# Patient Record
Sex: Female | Born: 1965 | Race: White | Hispanic: No | Marital: Married | Smoking: Former smoker
Health system: Southern US, Community
[De-identification: ages and names within clinical notes are randomized; demographics above are authoritative.]

---

## 1999-11-26 ENCOUNTER — Ambulatory Visit (HOSPITAL_COMMUNITY): Admission: RE | Admit: 1999-11-26 | Discharge: 1999-11-26 | Payer: Self-pay | Admitting: Family Medicine

## 1999-11-26 ENCOUNTER — Encounter: Payer: Self-pay | Admitting: Family Medicine

## 2000-03-16 ENCOUNTER — Other Ambulatory Visit: Admission: RE | Admit: 2000-03-16 | Discharge: 2000-03-16 | Payer: Self-pay | Admitting: Obstetrics and Gynecology

## 2000-03-30 ENCOUNTER — Encounter (INDEPENDENT_AMBULATORY_CARE_PROVIDER_SITE_OTHER): Payer: Self-pay

## 2000-03-30 ENCOUNTER — Other Ambulatory Visit: Admission: RE | Admit: 2000-03-30 | Discharge: 2000-03-30 | Payer: Self-pay | Admitting: Obstetrics and Gynecology

## 2000-11-17 ENCOUNTER — Encounter: Admission: RE | Admit: 2000-11-17 | Discharge: 2000-11-17 | Payer: Self-pay | Admitting: Family Medicine

## 2000-11-17 ENCOUNTER — Encounter: Payer: Self-pay | Admitting: Family Medicine

## 2001-01-07 ENCOUNTER — Ambulatory Visit (HOSPITAL_COMMUNITY): Admission: RE | Admit: 2001-01-07 | Discharge: 2001-01-07 | Payer: Self-pay | Admitting: Family Medicine

## 2001-01-07 ENCOUNTER — Encounter: Payer: Self-pay | Admitting: Family Medicine

## 2001-01-10 ENCOUNTER — Emergency Department (HOSPITAL_COMMUNITY): Admission: EM | Admit: 2001-01-10 | Discharge: 2001-01-10 | Payer: Self-pay | Admitting: Emergency Medicine

## 2001-01-13 ENCOUNTER — Encounter (INDEPENDENT_AMBULATORY_CARE_PROVIDER_SITE_OTHER): Payer: Self-pay | Admitting: Specialist

## 2001-01-13 ENCOUNTER — Observation Stay (HOSPITAL_COMMUNITY): Admission: RE | Admit: 2001-01-13 | Discharge: 2001-01-14 | Payer: Self-pay | Admitting: Surgery

## 2001-01-13 ENCOUNTER — Encounter: Payer: Self-pay | Admitting: Surgery

## 2001-08-18 ENCOUNTER — Other Ambulatory Visit: Admission: RE | Admit: 2001-08-18 | Discharge: 2001-08-18 | Payer: Self-pay | Admitting: Obstetrics and Gynecology

## 2002-11-21 ENCOUNTER — Other Ambulatory Visit: Admission: RE | Admit: 2002-11-21 | Discharge: 2002-11-21 | Payer: Self-pay | Admitting: Obstetrics and Gynecology

## 2003-06-01 ENCOUNTER — Encounter: Payer: Self-pay | Admitting: Obstetrics and Gynecology

## 2003-06-01 ENCOUNTER — Ambulatory Visit (HOSPITAL_COMMUNITY): Admission: RE | Admit: 2003-06-01 | Discharge: 2003-06-01 | Payer: Self-pay | Admitting: Obstetrics and Gynecology

## 2005-01-13 ENCOUNTER — Other Ambulatory Visit: Admission: RE | Admit: 2005-01-13 | Discharge: 2005-01-13 | Payer: Self-pay | Admitting: Obstetrics and Gynecology

## 2007-11-10 ENCOUNTER — Other Ambulatory Visit: Payer: Self-pay | Admitting: Obstetrics and Gynecology

## 2007-12-01 ENCOUNTER — Ambulatory Visit (HOSPITAL_COMMUNITY): Admission: RE | Admit: 2007-12-01 | Discharge: 2007-12-01 | Payer: Self-pay | Admitting: Obstetrics and Gynecology

## 2007-12-01 ENCOUNTER — Encounter (INDEPENDENT_AMBULATORY_CARE_PROVIDER_SITE_OTHER): Payer: Self-pay | Admitting: Obstetrics and Gynecology

## 2009-03-12 ENCOUNTER — Encounter
Admission: RE | Admit: 2009-03-12 | Discharge: 2009-03-12 | Payer: Self-pay | Admitting: Physical Medicine and Rehabilitation

## 2010-05-28 ENCOUNTER — Encounter: Admission: RE | Admit: 2010-05-28 | Discharge: 2010-05-28 | Payer: Self-pay | Admitting: Orthopedic Surgery

## 2011-02-17 NOTE — Op Note (Signed)
[name], [name]            ACCOUNT NO.:  [id number]   MEDICAL RECORD NO.:  [id number]          PATIENT TYPE:  AMB   LOCATION:  SDC                           FACILITY:  [facility]   PHYSICIAN:  Malva Limes, M.D.    DATE OF BIRTH:  October 15, 1965   DATE OF PROCEDURE:  DATE OF DISCHARGE:                               OPERATIVE REPORT   PREOPERATIVE DIAGNOSES:  1. Menorrhagia.  2. Endometrial polyp.  3. Persistent ovarian cyst.   POSTOPERATIVE DIAGNOSES:  1. Menorrhagia.  2. Endometrial polyp.  3. Persistent ovarian cyst.   PROCEDURE:  1. Diagnostic laparoscopy with cauterization of endometriosis and      lysis of adhesions.  2. Hysteroscopy with dilatation and curettage.   SURGEON:  Malva Limes, MD   ANESTHESIA:  General.   ANTIBIOTICS:  Ancef 1 g.   DRAINS:  None.   ESTIMATED BLOOD LOSS:  Minimal.   COMPLICATIONS:  None.   SPECIMENS:  Endometrial curettings sent to pathology.   PROCEDURE IN DETAIL:  The patient was taken to the operating room where  she was placed in the dorsal supine position.  General anesthetic was  administered without complications.  She was then placed in the dorsal  lithotomy position.  She was prepped with Betadine and draped in the  usual fashion for this procedure.   Her umbilicus was injected with 0.25% Marcaine.  A vertical skin  incision was made.  This was carried down to fascia.  The fascia was  entered in the midline and extended vertically.  The parietoperitoneum  was entered bluntly.  A suture was placed in pursestring fashion around  the incision.  The Hassan cannula was placed in the abdominal cavity.  Three liters of carbon dioxide was insufflated.  The scope was then  placed.  The patient was then placed in Trendelenburg.  A 5 mm port was  then placed in the suprapubic region under direct visualization.  Under  examination the patient was found to have endometriosis in the anterior  cul-de-sac on the right.  Also both ovaries  were adherent to the pelvic  side wall and posterior cul-de-sac.  There was minimal amounts of  endometriosis scattered throughout the posterior cul-de-sac.  The  fallopian tubes were normal bilaterally.  The uterus appeared to be  normal.  There were no adhesions of bowel.   At this point, the left ovary was grasped.  What was felt to be a cyst  was likely a peritoneal cyst filled with bloody fluid.  This was taken  down with blunt dissection.  The adhesions involving the left ovary to  the posterior cul-de-sac were all freed with blunt dissection, then the  ovary, and lifted up out of the pelvis.  The areas of endometriosis were  cauterized.  The right ovary was more densely adherent.  This was also  freed with blunt dissection.  Following this, the area of endometriosis  in the anterior cul-de-sac were all cauterized.  The cyst in the left  ovary was just a follicular cyst when this was opened.  Clear fluid was  removed.  There was no  evidence of endometrioma.   At this point the procedure was concluded.  Copious irrigation revealed  no evidence of any bleeding.  The pneumoperitoneum, instruments were  removed.  The incision closed with 0 Vicryl suture and the skin with 3-0  Vicryl suture.  At this time hysteroscopy was begun.  The cervix was  grasped and dilated to a 27-French.  The hysteroscope was advanced  through the endocervical canal and after this was done, the patient was  found to have 4 large polyps in the uterine cavity.  These were all  removed with sharp curettage and sent to pathology.   This concluded the procedure.  Instruments were removed from the vagina.  The patient was awoken and taken to the recovery room in stable  condition.  Instrument and lap counts were correct x2.  The patient will  be discharged to home.  She will be sent home with Percocet to take  p.r.n.  She will follow up in the office in 4 weeks.           ______________________________  Malva Limes, M.D.     MA/MEDQ  D:  12/01/2007  T:  12/01/2007  Job:  [id number]

## 2011-02-20 NOTE — Op Note (Signed)
[facility]  Patient:    [name], [name]                      MRN: [id number] Proc. Date: 01/13/01 Adm. Date:  [id number] Attending:  Andre Lefort CC:         Elvina Sidle, M.D.   Operative Report  DATE OF BIRTH:  [birth year]  PREOPERATIVE DIAGNOSES:  Chronic cholecystitis and cholelithiasis.  POSTOPERATIVE DIAGNOSES:  Chronic cholecystitis and cholelithiasis.  PROCEDURE:  Laparoscopic cholecystectomy with intraoperative cholangiogram.  SURGEON:  Dr. Ezzard Standing.  FIRST ASSISTANT:  Dr. Claud Kelp.  ANESTHESIA:  General endotracheal.  ESTIMATED BLOOD LOSS:  Minimal.  INDICATIONS FOR PROCEDURE:  Ms. [name] is a 45 year old white female whose been plagued with epigastric right upper quadrant abdominal pain who presented to the emergency room at [facility] three days ago with an acute attack of this pain and has documented cholelithiasis by ultrasound with normal liver functions. She now comes for attempted laparoscopic cholecystectomy. The indications and complications of the procedure are explained to the patient.  DESCRIPTION OF PROCEDURE:  The patient was taken to the operating room where she underwent a general anesthetic supervised by Dr. Richardean Chimera. She had PAS stockings in place and was given 1 gm of Ancef at the initiation of the procedure. Her abdomen was prepped with Betadine solution and sterilely draped.  An infraumbilical incision was made with sharp dissection and carried down to the abdominal cavity. A zero degree 10 mm laparoscope was inserted through a 12 mm Hasson trocar and this was secured with a #0 Vicryl suture. Laparoscopic exploration revealed the right and left lobe of the liver were unremarkable. The anterior wall of the stomach was unremarkable. The remainder of the bowel was pretty much obscured by her omentum but I saw no other nodularity or mass.  Three additional trocars were placed, a 10  mm subxiphoid trocar, a 5 mm ethicon trocar in the mid subcostal area and a 5 mm ethicon trocar in the right lateral subcostal location.  The gallbladder was then grasped. It was noted to have some thickening and whiteness to the wall of the gallbladder. There were also adhesions up about midway of the gallbladder wall which were taken down with blunt and sharp dissection.  The duodenum was also stuck down through the infundibulum of the gallbladder.  The cystic duct was isolated and a clip placed across the gallbladder side of the cystic duct. An intraoperative cholangiogram was then obtained.  Intraoperative cholangiogram was obtained using cut off taut catheter inserted through a 14 gauge Jelco catheter into the abdominal cavity and the taut catheter was inserted through the side of the cut cystic duct. The catheter was then secured with an endoclip.  The intraoperative cholangiogram was shot used half strength Hypaque solution approximately 10 cc under direct fluoroscopy showing contrast flowing down the cystic duct into the common bile duct into the duodenum and this was considered a normal intraoperative cholangiogram. The taut catheter was then removed. The cystic duct was triply endoclipped and divided. At least two branches were identified, one anterior and one posterior. These are both doubly clipped and then divided.  The gallbladder was then sharply and bluntly dissected from the gallbladder bed using primarily hook and Bovie coagulation.  Prior to complete division of the gallbladder from the gallbladder bed, the triangle of Calot and the gallbladder bed were visualized. There was no bile leak and no bleeding. The gallbladder  was then divided from the liver and brought out through the umbilicus intact and sent to pathology.  I then closed the umbilical port with a #0 Vicryl suture which was there. I then removed each trocar under direct visualization. There was no  bleeding in any trocar site. The skin at each site was closed with a 5-0 Vicryl, painted with tinctured Benzoin and Steri-Strips and sterilely dressed.  The patient tolerated the procedure well and was transported to the recovery room in good condition. DD:  01/13/01 TD:  01/13/01 Job: [id number]

## 2011-02-20 NOTE — Consult Note (Signed)
[facility]  Patient:    [name], [name]                      MRN: [id number] Proc. Date: 01/10/01 Adm. Date:  [id number] Disc. Date: [id number] Attending:  Tobey Bride CC:         Elvina Sidle, M.D.   Consultation Report  DATE OF BIRTH:  July 13, 1966  HISTORY OF PRESENT ILLNESS:  [name] is a 45 year old white female who is a patient of Dr. Elvina Sidle at [facility], who over the last two weeks has had episodic attacks of epigastric and right upper quadrant pain, particularly after eating any kind of food.  She said that before this, she has had the occasional  indigestion, but the last two weeks has become more regular.  She was feeling bad enough that she had seen Dr. Milus Glazier or one of his partners, and had an ultrasound of her gallbladder obtained at [facility] on Friday, January 07, 2001, and this showed numerous gallstones with residual sludge noted.   The patient has no history of peptic ulcer disease, liver disease, hepatitis, or pancreatic disease.  She has had no prior colon disease, colitis, or Crohns disease.  ALLERGIES:  No known drug allergies.  CURRENT MEDICATIONS:  She takes occasional Zyrtec for allergies.  REVIEW OF SYSTEMS:  NEUROLOGIC:  No seizure or loss of consciousness. PULMONARY: She had asthma as a child, but this has resolved as an adult.  She has had no adult problems with infections.  CARDIAC:  No history of chest pain or heart attack.  UROLOGIC:  No history of kidney stones or kidney infections.  GYNECOLOGIC:  Her  last period was December 09, 2000.  She did have a D&C by Dr. Janeece Riggers. [place] within the last year or so.  She also apparently had a pregnancy as a teenager, so she is gravida 2, para 0, abortus 2.  SOCIAL HISTORY:  She works as a Pensions consultant at [employer].  She is engaged to be married but is not married at this time.  PHYSICAL EXAMINATION:  VITAL  SIGNS:  Temperature 97.6 degrees, respirations 20, pulse 83, blood pressure 127/72.  GENERAL:  She is a well-nourished white female, alert and cooperative on physical examination.  HEENT:  Unremarkable.  NECK:  Supple, no mass, no thyromegaly.  No lymphadenopathy,.  LUNGS:  Clear to auscultation.  HEART:  A regular rate and rhythm without murmur or rub.  ABDOMEN:  Soft.  She has no guarding, no rebound, no tenderness, no mass.  GENITOURINARY:  Deferred.  RECTAL:  Deferred.  EXTREMITIES:  She has good strength in all four extremities.  NEUROLOGIC:  Grossly intact.  LABORATORY DATA:  That I have at this time, again her ultrasound shows gallstones with sludge.  Hemoglobin 13.6, white blood count 5600, normal differential.  Other labs pending are amylase and a metabolic panel.  IMPRESSION: 1. [name] has symptomatic cholelithiasis.  RECOMMENDATIONS:  Discussed with her about proceeding with a laparoscopic cholecystectomy.  I do not think this is urgent and needs to be done today.  I gave her a prescription for some Vicodin #20 tablets with no refills.  She is  going to go by my office today to get scheduled for the surgery for a lap cholecystectomy within the next one to two weeks.  She knows that if she has further problems or complaints, to either call me back or my  partners who are on call.  2. History of childhood asthma. 3. History of occasional allergies. DD:  01/10/01 TD:  01/10/01 Job: [id number]

## 2011-03-16 ENCOUNTER — Other Ambulatory Visit: Payer: Self-pay | Admitting: Obstetrics and Gynecology

## 2011-06-26 LAB — CBC
HCT: 38.1
HCT: 38.7
Hemoglobin: 13.1
Platelets: 334
Platelets: 367
RDW: 13.9
RDW: 14.4
WBC: 9.6
WBC: 9.2

## 2014-07-10 ENCOUNTER — Other Ambulatory Visit: Payer: Self-pay | Admitting: Obstetrics and Gynecology

## 2014-07-11 LAB — CYTOLOGY - PAP

## 2015-07-29 ENCOUNTER — Other Ambulatory Visit: Payer: Self-pay | Admitting: Obstetrics and Gynecology

## 2015-07-31 LAB — CYTOLOGY - PAP

## 2015-12-03 ENCOUNTER — Ambulatory Visit: Payer: Self-pay

## 2015-12-03 ENCOUNTER — Other Ambulatory Visit: Payer: Self-pay | Admitting: Occupational Medicine

## 2015-12-03 DIAGNOSIS — Z0000 Encounter for general adult medical examination without abnormal findings: Secondary | ICD-10-CM

## 2016-08-10 ENCOUNTER — Ambulatory Visit (INDEPENDENT_AMBULATORY_CARE_PROVIDER_SITE_OTHER): Payer: Self-pay | Admitting: Orthopedic Surgery

## 2016-08-31 ENCOUNTER — Other Ambulatory Visit: Payer: Self-pay | Admitting: Obstetrics and Gynecology

## 2016-09-02 LAB — CYTOLOGY - PAP

## 2016-10-02 DIAGNOSIS — B009 Herpesviral infection, unspecified: Secondary | ICD-10-CM | POA: Insufficient documentation

## 2016-11-13 DIAGNOSIS — J189 Pneumonia, unspecified organism: Secondary | ICD-10-CM | POA: Diagnosis not present

## 2016-11-27 DIAGNOSIS — R05 Cough: Secondary | ICD-10-CM | POA: Diagnosis not present

## 2016-11-27 DIAGNOSIS — M25512 Pain in left shoulder: Secondary | ICD-10-CM | POA: Diagnosis not present

## 2017-03-01 DIAGNOSIS — B9689 Other specified bacterial agents as the cause of diseases classified elsewhere: Secondary | ICD-10-CM | POA: Diagnosis not present

## 2017-03-01 DIAGNOSIS — J019 Acute sinusitis, unspecified: Secondary | ICD-10-CM | POA: Diagnosis not present

## 2017-03-01 DIAGNOSIS — R21 Rash and other nonspecific skin eruption: Secondary | ICD-10-CM | POA: Diagnosis not present

## 2017-05-27 DIAGNOSIS — M62838 Other muscle spasm: Secondary | ICD-10-CM | POA: Insufficient documentation

## 2017-05-27 DIAGNOSIS — M216X2 Other acquired deformities of left foot: Secondary | ICD-10-CM | POA: Diagnosis not present

## 2017-05-27 DIAGNOSIS — M2042 Other hammer toe(s) (acquired), left foot: Secondary | ICD-10-CM | POA: Insufficient documentation

## 2017-05-27 DIAGNOSIS — M216X1 Other acquired deformities of right foot: Secondary | ICD-10-CM | POA: Diagnosis not present

## 2017-05-27 DIAGNOSIS — M722 Plantar fascial fibromatosis: Secondary | ICD-10-CM | POA: Diagnosis not present

## 2017-07-15 DIAGNOSIS — M722 Plantar fascial fibromatosis: Secondary | ICD-10-CM | POA: Diagnosis not present

## 2017-08-12 DIAGNOSIS — Z0000 Encounter for general adult medical examination without abnormal findings: Secondary | ICD-10-CM | POA: Diagnosis not present

## 2017-08-13 DIAGNOSIS — R0602 Shortness of breath: Secondary | ICD-10-CM | POA: Diagnosis not present

## 2017-08-13 DIAGNOSIS — Z13220 Encounter for screening for lipoid disorders: Secondary | ICD-10-CM | POA: Diagnosis not present

## 2017-08-13 DIAGNOSIS — Z136 Encounter for screening for cardiovascular disorders: Secondary | ICD-10-CM | POA: Diagnosis not present

## 2017-08-13 DIAGNOSIS — Z131 Encounter for screening for diabetes mellitus: Secondary | ICD-10-CM | POA: Diagnosis not present

## 2017-08-13 DIAGNOSIS — M79609 Pain in unspecified limb: Secondary | ICD-10-CM | POA: Diagnosis not present

## 2017-08-18 DIAGNOSIS — M5417 Radiculopathy, lumbosacral region: Secondary | ICD-10-CM | POA: Diagnosis not present

## 2017-08-31 DIAGNOSIS — Z1329 Encounter for screening for other suspected endocrine disorder: Secondary | ICD-10-CM | POA: Diagnosis not present

## 2017-08-31 DIAGNOSIS — D6869 Other thrombophilia: Secondary | ICD-10-CM | POA: Diagnosis not present

## 2017-09-06 DIAGNOSIS — Z800 Family history of malignant neoplasm of digestive organs: Secondary | ICD-10-CM | POA: Diagnosis not present

## 2017-09-06 DIAGNOSIS — Z801 Family history of malignant neoplasm of trachea, bronchus and lung: Secondary | ICD-10-CM | POA: Diagnosis not present

## 2017-09-06 DIAGNOSIS — Z01419 Encounter for gynecological examination (general) (routine) without abnormal findings: Secondary | ICD-10-CM | POA: Diagnosis not present

## 2017-09-06 DIAGNOSIS — Z124 Encounter for screening for malignant neoplasm of cervix: Secondary | ICD-10-CM | POA: Diagnosis not present

## 2017-09-06 DIAGNOSIS — Z1231 Encounter for screening mammogram for malignant neoplasm of breast: Secondary | ICD-10-CM | POA: Diagnosis not present

## 2017-10-08 DIAGNOSIS — Q667 Congenital pes cavus: Secondary | ICD-10-CM | POA: Diagnosis not present

## 2017-10-08 DIAGNOSIS — M79671 Pain in right foot: Secondary | ICD-10-CM | POA: Diagnosis not present

## 2017-11-09 DIAGNOSIS — M7751 Other enthesopathy of right foot: Secondary | ICD-10-CM | POA: Diagnosis not present

## 2017-11-09 DIAGNOSIS — M25774 Osteophyte, right foot: Secondary | ICD-10-CM | POA: Diagnosis not present

## 2017-11-09 DIAGNOSIS — M659 Synovitis and tenosynovitis, unspecified: Secondary | ICD-10-CM | POA: Diagnosis not present

## 2017-11-15 DIAGNOSIS — K5900 Constipation, unspecified: Secondary | ICD-10-CM | POA: Diagnosis not present

## 2017-11-16 DIAGNOSIS — M722 Plantar fascial fibromatosis: Secondary | ICD-10-CM | POA: Diagnosis not present

## 2017-11-16 DIAGNOSIS — M659 Synovitis and tenosynovitis, unspecified: Secondary | ICD-10-CM | POA: Diagnosis not present

## 2017-11-16 DIAGNOSIS — M7751 Other enthesopathy of right foot: Secondary | ICD-10-CM | POA: Diagnosis not present

## 2017-12-07 DIAGNOSIS — K5730 Diverticulosis of large intestine without perforation or abscess without bleeding: Secondary | ICD-10-CM | POA: Diagnosis not present

## 2017-12-07 DIAGNOSIS — Z1211 Encounter for screening for malignant neoplasm of colon: Secondary | ICD-10-CM | POA: Diagnosis not present

## 2017-12-29 ENCOUNTER — Ambulatory Visit (INDEPENDENT_AMBULATORY_CARE_PROVIDER_SITE_OTHER): Payer: Self-pay

## 2017-12-29 ENCOUNTER — Ambulatory Visit (INDEPENDENT_AMBULATORY_CARE_PROVIDER_SITE_OTHER): Payer: 59 | Admitting: Orthopedic Surgery

## 2017-12-29 DIAGNOSIS — M545 Low back pain: Secondary | ICD-10-CM | POA: Diagnosis not present

## 2017-12-29 DIAGNOSIS — G8929 Other chronic pain: Secondary | ICD-10-CM | POA: Diagnosis not present

## 2017-12-29 DIAGNOSIS — M25571 Pain in right ankle and joints of right foot: Secondary | ICD-10-CM | POA: Diagnosis not present

## 2018-01-01 ENCOUNTER — Encounter (INDEPENDENT_AMBULATORY_CARE_PROVIDER_SITE_OTHER): Payer: Self-pay | Admitting: Orthopedic Surgery

## 2018-01-01 NOTE — Progress Notes (Signed)
Office Visit Note   Patient: [name]           Date of Birth: 08-14-1966           MRN: [id number] Visit Date: 12/29/2017 Requested by: No referring provider defined for this encounter. PCP: System, Pcp Not In  Subjective: Chief Complaint  Patient presents with  . Lower Back - Pain  . Right Foot - Pain    HPI: Barba is a patient with right foot pain.  Reports pain on the lateral aspect of her foot which radiates around to the toes.  She did have some type of nerve study which suggested that she had some type of nerve compression affecting both legs.  She does describe a dull back pain.  It is hard for her to walk after getting up.  She has had 2 shots in the foot which have helped her for 1 day.  She has seen a foot doctor who recommended spur removal from the dorsal aspect of the foot.  She is here for another opinion about whether or not she should pursue foot surgery at this time.              ROS: All systems reviewed are negative as they relate to the chief complaint within the history of present illness.  Patient denies  fevers or chills.   Assessment & Plan: Visit Diagnoses:  1. Chronic right-sided low back pain, with sciatica presence unspecified   2. Pain in right ankle and joints of right foot     Plan: Impression is pain in the foot which looks like it be back mediated.  She has had a nerve study which suggested as much.  The dorsal spur is pretty minimal on that right foot.  I do not know if I can attribute all of her symptoms to that fairly small spur at the navicular cuneiform joint.  I think it is more likely that she has right-sided radiculopathy from her lumbar spine.  She is tried Duexis without any relief.  Plan MRI scan lumbar spine to evaluate right-sided radiculopathy.  I will see her back after that study.  Follow-Up Instructions: Return for after MRI.   Orders:  Orders Placed This Encounter  Procedures  . XR Foot Complete Right  . XR Lumbar  Spine 2-3 Views  . MR Lumbar Spine w/o contrast   No orders of the defined types were placed in this encounter.     Procedures: No procedures performed   Clinical Data: No additional findings.  Objective: Vital Signs: There were no vitals taken for this visit.  Physical Exam:   Constitutional: Patient appears well-developed HEENT:  Head: Normocephalic Eyes:EOM are normal Neck: Normal range of motion Cardiovascular: Normal rate Pulmonary/chest: Effort normal Neurologic: Patient is alert Skin: Skin is warm Psychiatric: Patient has normal mood and affect    Ortho Exam: Orthopedic exam demonstrates normal gait and alignment.  Palpable pedal pulses.  I do not really detect much in the way of asymmetry in the bony contours on the dorsal aspect of the right foot versus left foot.  Negative Tinel's in this region.  She has palpable intact nontender anterior to posterior to peroneal and Achilles tendons with symmetric tibiotalar subtalar transverse tarsal range of motion.  No other masses lymphadenopathy or skin changes noted in that right foot region.  Reflexes symmetric bilateral patella and Achilles.  Negative Babinski negative clonus on that right-hand side.  No groin pain with internal/external rotation of  either leg.  Specialty Comments:  No specialty comments available.  Imaging: Xr Foot Complete Right  Result Date: 01/01/2018 AP lateral oblique right foot reviewed.  Minimal midfoot and tarsometatarsal degenerative changes present.  Small bunionette deformity is noted.  No significant spurring on the dorsal aspect of the right foot.  Xr Lumbar Spine 2-3 Views  Result Date: 01/01/2018 AP lateral lumbar spine reviewed.  Visualized hips normal.  Bar spine no spondylolisthesis or compression fractures.  Only mild facet arthritis is noted in the lower lumbar levels.    PMFS History: There are no active problems to display for this patient.  No past medical history on file.   No family history on file.   Social History   Occupational History  . Not on file  Tobacco Use  . Smoking status: Not on file  Substance and Sexual Activity  . Alcohol use: Not on file  . Drug use: Not on file  . Sexual activity: Not on file

## 2018-01-10 ENCOUNTER — Ambulatory Visit
Admission: RE | Admit: 2018-01-10 | Discharge: 2018-01-10 | Disposition: A | Discharge status: Home / Self Care | Payer: 59 | Source: Ambulatory Visit | Attending: Orthopedic Surgery | Admitting: Orthopedic Surgery

## 2018-01-10 DIAGNOSIS — M5416 Radiculopathy, lumbar region: Secondary | ICD-10-CM | POA: Diagnosis not present

## 2018-01-10 DIAGNOSIS — G8929 Other chronic pain: Secondary | ICD-10-CM

## 2018-01-10 DIAGNOSIS — M545 Low back pain: Principal | ICD-10-CM

## 2018-01-17 DIAGNOSIS — K5900 Constipation, unspecified: Secondary | ICD-10-CM | POA: Diagnosis not present

## 2018-01-24 ENCOUNTER — Ambulatory Visit (INDEPENDENT_AMBULATORY_CARE_PROVIDER_SITE_OTHER): Payer: 59 | Admitting: Orthopedic Surgery

## 2018-01-24 ENCOUNTER — Encounter (INDEPENDENT_AMBULATORY_CARE_PROVIDER_SITE_OTHER): Payer: Self-pay | Admitting: Orthopedic Surgery

## 2018-01-24 DIAGNOSIS — M25571 Pain in right ankle and joints of right foot: Secondary | ICD-10-CM | POA: Diagnosis not present

## 2018-01-25 ENCOUNTER — Encounter (INDEPENDENT_AMBULATORY_CARE_PROVIDER_SITE_OTHER): Payer: Self-pay | Admitting: Orthopedic Surgery

## 2018-01-25 NOTE — Progress Notes (Signed)
Office Visit Note   Patient: [name]           Date of Birth: 01-31-1966           MRN: [id number] Visit Date: 01/24/2018 Requested by: No referring provider defined for this encounter. PCP: System, Pcp Not In  Subjective: Chief Complaint  Patient presents with  . Lower Back - Follow-up    HPI: Ayza is a patient with right sided foot and ankle pain.  She brings her EMG nerve study which is normal.  She is also had an MRI scan of her lumbar spine which is negative for any right-sided compression lesions.  She is had 6 months of symptoms.  Started with plantar fasciitis.  She does not have diabetes.  She has had 2 injections in the area on the dorsal aspect of her foot that is symptomatic.  She reports continued pain with some foot wear and with some ambulation.  Does not report much in the way of plantar foot symptoms but it is mostly dorsal.  She also has some pain in the metatarsal region as well as lateral ankle pain.              ROS: All systems reviewed are negative as they relate to the chief complaint within the history of present illness.  Patient denies  fevers or chills.   Assessment & Plan: Visit Diagnoses:  1. Pain in right ankle and joints of right foot     Plan: Impression is dorsal foot pain shoes which could be easily adjusted to help with the symptoms.  We will also try topical anti-inflammatories.  Dr. due to came in and evaluated the patient also for surgical consideration for spur removal.  He stated that the spur is very small and may be contributing some to her symptoms but not to the degree that surgery is indicated before trying some of these nonoperative measures.  To that end we have recommended a specific arch for her relatively high arches as well as Trail running sneakers of a specific variety which could help as well.  See her back as needed  Follow-Up Instructions: Return if symptoms worsen or fail to improve.   Orders:  No orders of the  defined types were placed in this encounter.  No orders of the defined types were placed in this encounter.     Procedures: No procedures performed   Clinical Data: No additional findings.  Objective: Vital Signs: There were no vitals taken for this visit.  Physical Exam:   Constitutional: Patient appears well-developed HEENT:  Head: Normocephalic Eyes:EOM are normal Neck: Normal range of motion Cardiovascular: Normal rate Pulmonary/chest: Effort normal Neurologic: Patient is alert Skin: Skin is warm Psychiatric: Patient has normal mood and affect    Ortho Exam: Ortho exam demonstrates full active and passive range of motion of the right ankle.  There is slight tenderness over the talonavicular joint where there is a very small spur noted.  She also has similar spur on the left-hand side which is about 50% smaller.  Patient has some tenderness between the first and second metatarsal head and she also has tenderness between the third and fourth metatarsal head.  There is also some tenderness to palpation of the peroneal tendons.  The peroneal tendons do not subluxate.  There is no real swelling in that right foot region.  Specialty Comments:  No specialty comments available.  Imaging: No results found.   PMFS History: There are no  active problems to display for this patient.  History reviewed. No pertinent past medical history.  History reviewed. No pertinent family history.  History reviewed. No pertinent surgical history. Social History   Occupational History  . Not on file  Tobacco Use  . Smoking status: Former Games developermoker  . Smokeless tobacco: Never Used  Substance and Sexual Activity  . Alcohol use: Not on file  . Drug use: Not on file  . Sexual activity: Not on file

## 2018-01-28 DIAGNOSIS — J0100 Acute maxillary sinusitis, unspecified: Secondary | ICD-10-CM | POA: Diagnosis not present

## 2018-02-03 ENCOUNTER — Telehealth (INDEPENDENT_AMBULATORY_CARE_PROVIDER_SITE_OTHER): Payer: Self-pay | Admitting: Orthopedic Surgery

## 2018-02-03 MED ORDER — DICLOFENAC SODIUM 2 % TD SOLN: TRANSDERMAL | 1 refills | Status: DC

## 2018-02-03 NOTE — Telephone Encounter (Signed)
rx submitted for patient FYI

## 2018-02-03 NOTE — Addendum Note (Signed)
Addended byPrescott Parma on: 02/03/2018 04:39 PM   Modules accepted: Orders

## 2018-02-03 NOTE — Telephone Encounter (Signed)
Patient called saying that she was given pennsaid samples and really liked it and was wondering if she could get a RX sent into her pharmacy. CB # [phone]

## 2018-02-05 NOTE — Telephone Encounter (Signed)
thx

## 2018-03-29 DIAGNOSIS — M898X7 Other specified disorders of bone, ankle and foot: Secondary | ICD-10-CM | POA: Diagnosis not present

## 2018-03-29 DIAGNOSIS — M79671 Pain in right foot: Secondary | ICD-10-CM | POA: Diagnosis not present

## 2018-03-29 DIAGNOSIS — G5791 Unspecified mononeuropathy of right lower limb: Secondary | ICD-10-CM | POA: Diagnosis not present

## 2018-03-29 DIAGNOSIS — M21611 Bunion of right foot: Secondary | ICD-10-CM | POA: Diagnosis not present

## 2018-04-22 DIAGNOSIS — M79671 Pain in right foot: Secondary | ICD-10-CM | POA: Diagnosis not present

## 2018-04-22 DIAGNOSIS — M2041 Other hammer toe(s) (acquired), right foot: Secondary | ICD-10-CM | POA: Diagnosis not present

## 2018-04-22 DIAGNOSIS — M21611 Bunion of right foot: Secondary | ICD-10-CM | POA: Diagnosis not present

## 2018-08-29 DIAGNOSIS — Z13220 Encounter for screening for lipoid disorders: Secondary | ICD-10-CM | POA: Diagnosis not present

## 2018-08-29 DIAGNOSIS — Z0001 Encounter for general adult medical examination with abnormal findings: Secondary | ICD-10-CM | POA: Diagnosis not present

## 2018-08-29 DIAGNOSIS — N6459 Other signs and symptoms in breast: Secondary | ICD-10-CM | POA: Diagnosis not present

## 2018-09-18 DIAGNOSIS — J019 Acute sinusitis, unspecified: Secondary | ICD-10-CM | POA: Diagnosis not present

## 2018-10-10 DIAGNOSIS — Z01419 Encounter for gynecological examination (general) (routine) without abnormal findings: Secondary | ICD-10-CM | POA: Diagnosis not present

## 2018-10-10 DIAGNOSIS — Z124 Encounter for screening for malignant neoplasm of cervix: Secondary | ICD-10-CM | POA: Diagnosis not present

## 2018-10-12 DIAGNOSIS — M542 Cervicalgia: Secondary | ICD-10-CM | POA: Diagnosis not present

## 2018-10-12 DIAGNOSIS — S134XXA Sprain of ligaments of cervical spine, initial encounter: Secondary | ICD-10-CM | POA: Diagnosis not present

## 2018-10-19 DIAGNOSIS — N6489 Other specified disorders of breast: Secondary | ICD-10-CM | POA: Diagnosis not present

## 2018-10-19 DIAGNOSIS — N6459 Other signs and symptoms in breast: Secondary | ICD-10-CM | POA: Diagnosis not present

## 2018-10-19 DIAGNOSIS — R928 Other abnormal and inconclusive findings on diagnostic imaging of breast: Secondary | ICD-10-CM | POA: Diagnosis not present

## 2018-10-19 DIAGNOSIS — N649 Disorder of breast, unspecified: Secondary | ICD-10-CM | POA: Diagnosis not present

## 2018-10-29 IMAGING — MR MR LUMBAR SPINE W/O CM
4 of 5 series · 27 of 48 positions shown · non-contrast
Comparison: Lumbar spine x-rays dated December 25, 2015.

CLINICAL DATA: Right-sided low back pain radiating to the right
foot.

EXAM:
MRI LUMBAR SPINE WITHOUT CONTRAST
TECHNIQUE: Multiplanar, multisequence MR imaging of the lumbar spine was
performed. No intravenous contrast was administered.

[Series 3: T2 post-contrast · sagittal · 4.0mm · 0.55mm/px · 6 of 13 slices shown]
[im 1/13]
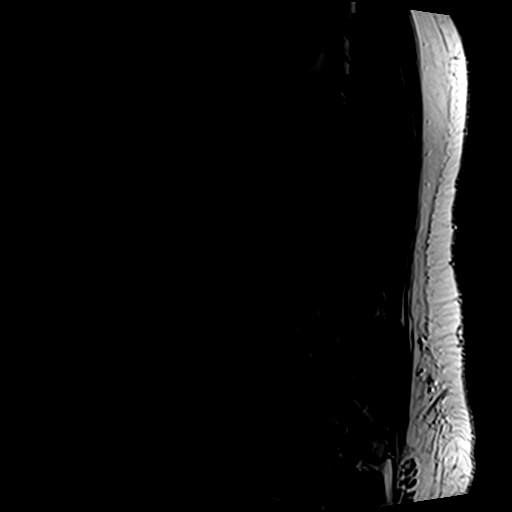
[im 3/13]
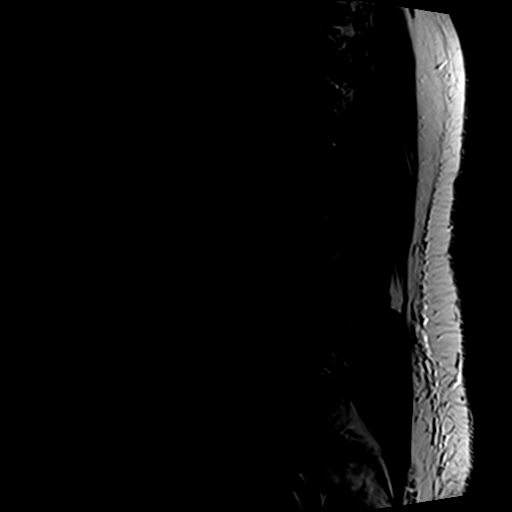
[im 5/13]
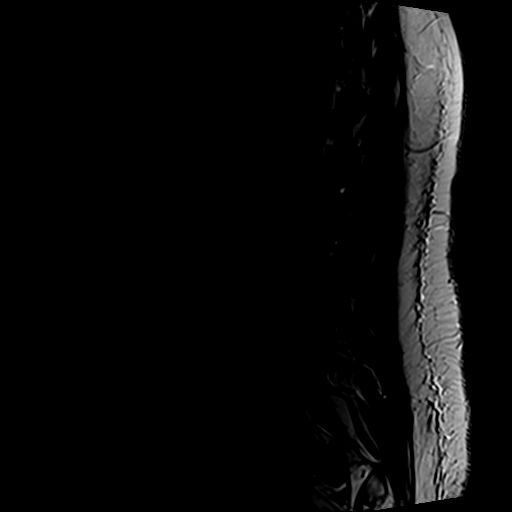
[im 8/13]
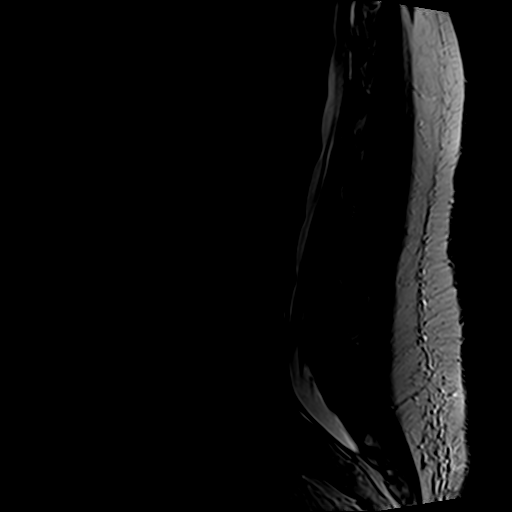
[im 10/13]
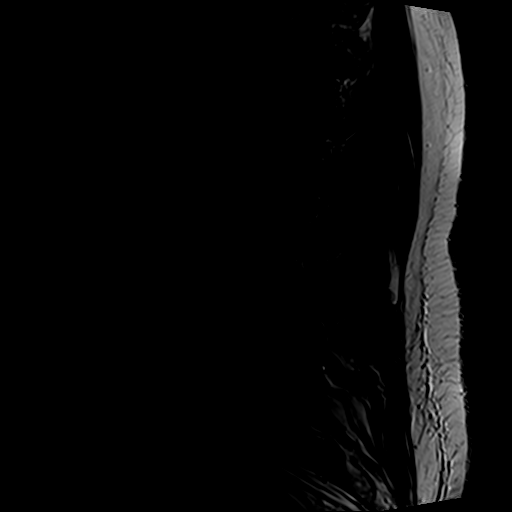
[im 13/13]
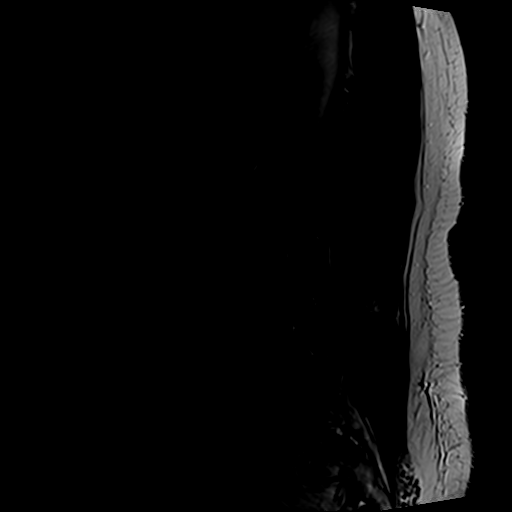

[Series 5: T1 · sagittal · 4.0mm · 0.55mm/px · 5 of 13 slices shown (1 of 2)]
[im 1/13]
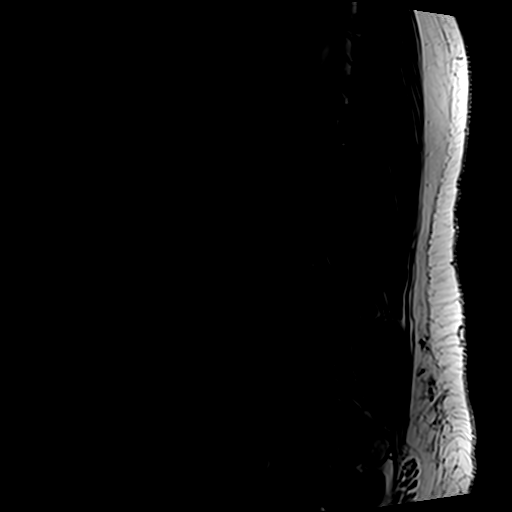
[im 4/13]
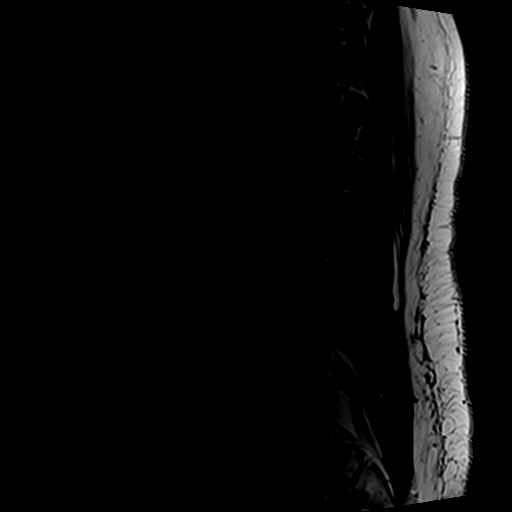
[im 7/13]
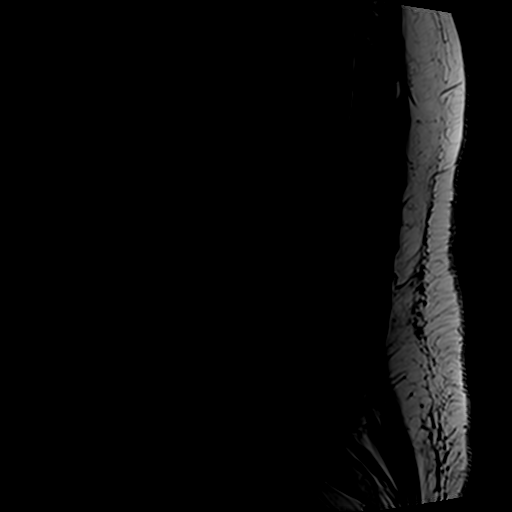
[im 10/13]
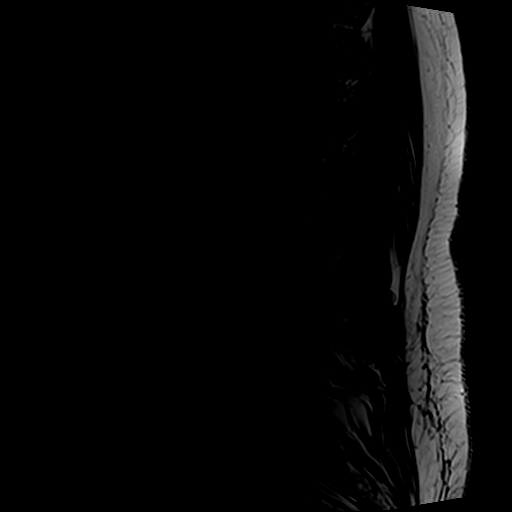
[im 13/13]
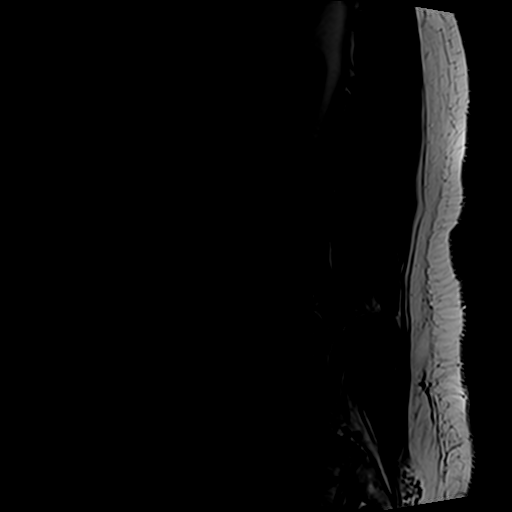

[Series 6: T1 · axial · 4.0mm · 0.35mm/px · z∈[-61,+118]mm · 6 of 38 slices shown (2 of 2)]
[im 3/38]
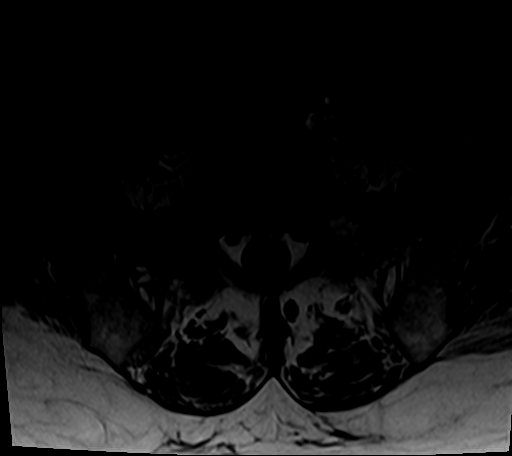
[im 5/38]
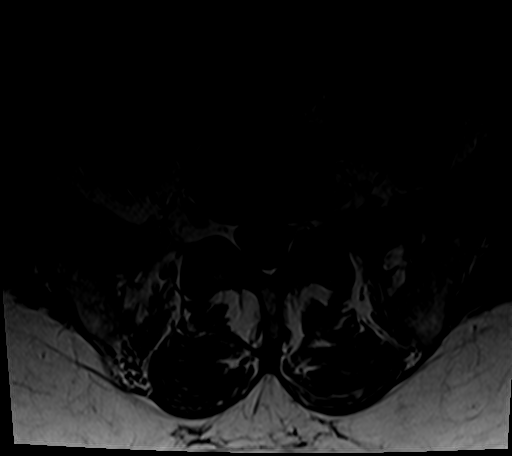
[im 8/38]
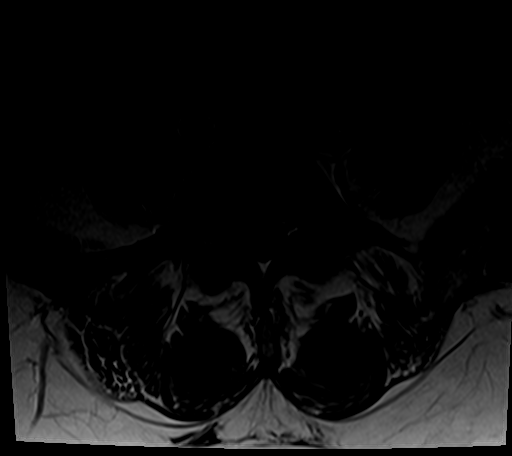
[im 13/38]
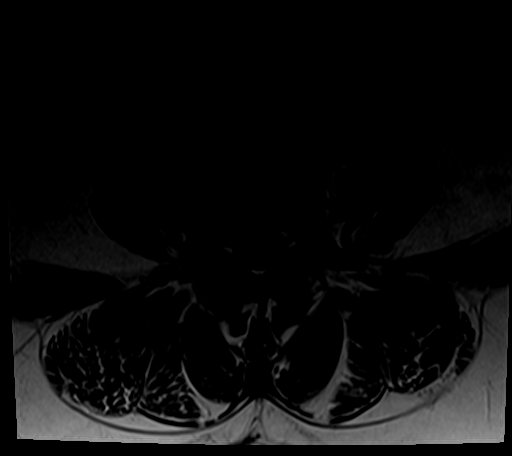
[im 20/38]
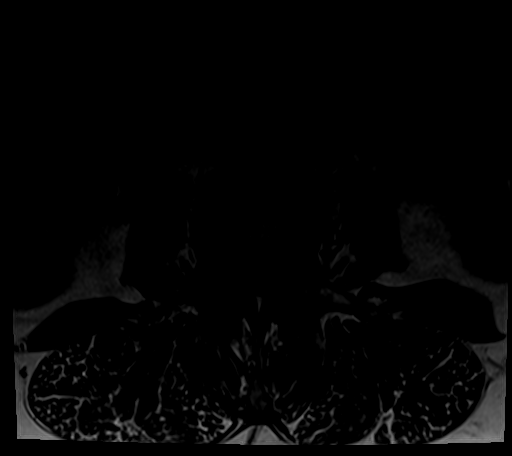
[im 33/38]
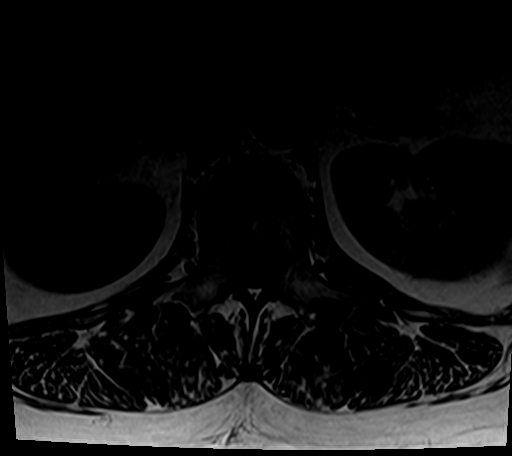

[Series 7: T2 · axial · 4.0mm · 0.70mm/px · z∈[-61,+144]mm · 10 of 38 slices shown]
[im 3/38]
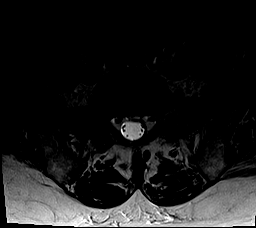
[im 5/38]
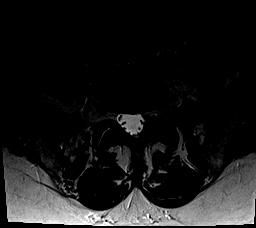
[im 8/38]
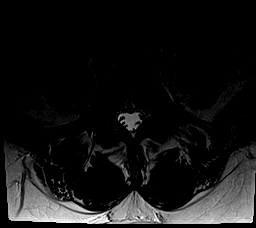
[im 13/38]
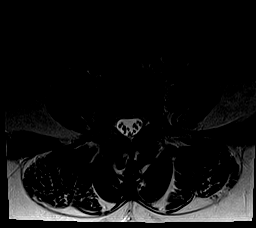
[im 18/38]
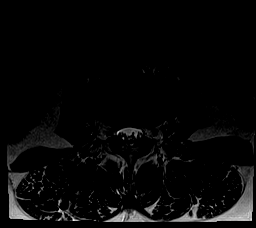
[im 20/38]
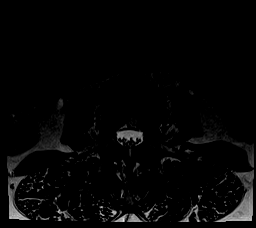
[im 23/38]
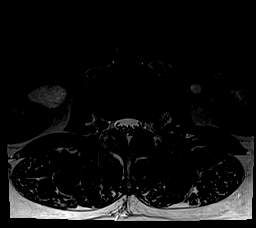
[im 28/38]
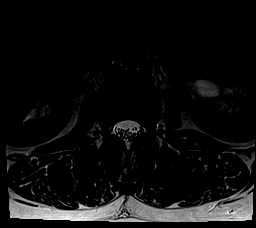
[im 33/38]
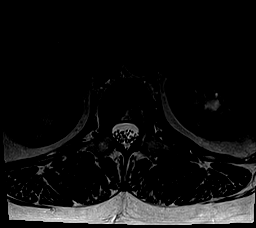
[im 38/38]
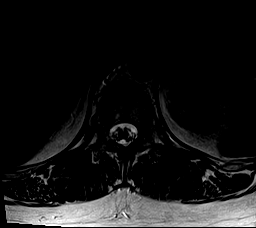

[27 of 48 positions shown; findings below may reference images not displayed]

FINDINGS: Segmentation:  Standard.

Alignment:  Physiologic.

Vertebrae:  No fracture, evidence of discitis, or bone lesion.

Conus medullaris and cauda equina: Conus extends to the L1 level.
Conus and cauda equina appear normal.

Paraspinal and other soft tissues: Negative.

Disc levels:

T11-T12: Only seen on the sagittal images. Trace diffuse disc bulge.
No spinal canal stenosis.

T12-L1:  Negative.

L1-L2: Tiny central and left paracentral disc extrusion. No
stenosis.

L2-L3:  Trace diffuse disc bulge.  No stenosis.

L3-L4: Small diffuse disc bulge and mild bilateral facet
arthropathy. Mild right lateral recess stenosis. No spinal canal or
neuroforaminal stenosis.

L4-L5: Tiny central disc protrusion with annular fissure. Mild
bilateral facet arthropathy. No stenosis.

L5-S1:  Mild bilateral facet arthropathy.  No stenosis.
IMPRESSION: 1. Minimal degenerative changes of the lumbar spine as described
above. No spinal canal or neuroforaminal stenosis at any level.

## 2019-03-01 DIAGNOSIS — R635 Abnormal weight gain: Secondary | ICD-10-CM | POA: Diagnosis not present

## 2019-03-01 DIAGNOSIS — R0602 Shortness of breath: Secondary | ICD-10-CM | POA: Diagnosis not present

## 2021-05-02 ENCOUNTER — Ambulatory Visit: Payer: 59 | Admitting: Orthopedic Surgery

## 2023-01-27 ENCOUNTER — Ambulatory Visit: Payer: 59 | Admitting: Orthopedic Surgery

## 2023-01-27 ENCOUNTER — Other Ambulatory Visit (INDEPENDENT_AMBULATORY_CARE_PROVIDER_SITE_OTHER): Payer: 59

## 2023-01-27 DIAGNOSIS — M25532 Pain in left wrist: Secondary | ICD-10-CM

## 2023-01-27 MED ORDER — GABAPENTIN 100 MG PO CAPS: ORAL_CAPSULE | ORAL | 0 refills | Status: AC

## 2023-01-29 ENCOUNTER — Encounter: Payer: Self-pay | Admitting: Orthopedic Surgery

## 2023-01-29 NOTE — Progress Notes (Signed)
Office Visit Note   Patient: [name]           Date of Birth: 01/16/1966           MRN: [id number] Visit Date: 01/27/2023 Requested by: No referring provider defined for this encounter. PCP: Pcp, No  Subjective: Chief Complaint  Patient presents with   Other     Left hand pain/numbness/tingling    HPI: [name] is a 57 y.o. female who presents to the office reporting left wrist pain 3 weeks duration.  Reports numbness and tingling.  Denies any history of injury.  Works at [employer].  Lifts a lot at work.  Diagnosed with carpal tunnel syndrome in the left wrist years ago.  She is right-hand dominant.  Pain wakes her from sleep at night.  She is dropping things.  Describes decreased strength.  Takes Aleve without much relief.  She is doing a new job at work.  Pain is a little bit more on the ulnar side..                ROS: All systems reviewed are negative as they relate to the chief complaint within the history of present illness.  Patient denies fevers or chills.  Assessment & Plan: Visit Diagnoses:  1. Pain in left wrist     Plan: Impression is left wrist pain which is a little bit more ulnar-sided than classically the case with carpal tunnel syndrome.  Nonetheless we will try her with some Neurontin as well as a nerve study to evaluate carpal tunnel syndrome in the left wrist.  Follow-up after that study.  Continue with wrist splint at night.  Follow-Up Instructions: No follow-ups on file.   Orders:  Orders Placed This Encounter  Procedures   XR Wrist Complete Left   Ambulatory referral to Physical Medicine Rehab   Meds ordered this encounter  Medications   gabapentin (NEURONTIN) 100 MG capsule    Sig: 1 po bid    Dispense:  60 capsule    Refill:  0      Procedures: No procedures performed   Clinical Data: No additional findings.  Objective: Vital Signs: There were no vitals taken for this visit.  Physical Exam:   Constitutional: Patient appears well-developed HEENT:  Head: Normocephalic Eyes:EOM are normal Neck: Normal range of motion Cardiovascular: Normal rate Pulmonary/chest: Effort normal Neurologic: Patient is alert Skin: Skin is warm Psychiatric: Patient has normal mood and affect  Ortho Exam: Ortho exam demonstrates negative Tinel's cubital tunnel in the left elbow.  Elbow range of motion is full.  Left wrist range of motion also full.  Equivocal carpal tunnel compression testing on the left.  EPL FPL interosseous strength intact.  No masses around the radiocarpal region.  No tenderness in the snuffbox or over the radial styloid.  No tenderness over the ulnar styloid and no subluxation of the ECU tendon is present.  Specialty Comments:  No specialty comments available.  Imaging: No results found.   PMFS History: There are no problems to display for this patient.  No past medical history on file.  No family history on file.  No past surgical history on file. Social History   Occupational History   Not on file  Tobacco Use   Smoking status: Former   Smokeless tobacco: Never  Substance and Sexual Activity   Alcohol use: Not on file   Drug use: Not on file   Sexual activity: Not on file

## 2023-02-05 ENCOUNTER — Ambulatory Visit: Payer: 59 | Admitting: Physical Medicine and Rehabilitation

## 2023-02-05 DIAGNOSIS — M542 Cervicalgia: Secondary | ICD-10-CM | POA: Diagnosis not present

## 2023-02-05 DIAGNOSIS — R202 Paresthesia of skin: Secondary | ICD-10-CM

## 2023-02-05 DIAGNOSIS — M79642 Pain in left hand: Secondary | ICD-10-CM | POA: Diagnosis not present

## 2023-02-05 NOTE — Progress Notes (Addendum)
[name] - 57 y.o. female MRN [id number]  Date of birth: 1965-11-23  Office Visit Note: Visit Date: 02/05/2023 PCP: Pcp, No Referred by: Cammy Copa, MD  Subjective: Chief Complaint  Patient presents with   Left Hand - Numbness, Pain   HPI:  [name] is a 57 y.o. female who comes in today at the request of Dr. Burnard Bunting for evaluation and management of chronic, worsening and severe pain, numbness and tingling in the Left upper extremities.  Patient is Right hand dominant.  She reports several weeks of worsening hand pain with numbness and tingling particularly in the radial digits.  Sometimes global pain however worse with using her hand.  She reports dropping objects and decreased strength.  She works at [employer] and does a lot of lifting at work.  She reports that she had had a diagnosis of carpal tunnel sometime ago but no electrodiagnostic studies noted in the chart.  She has some neck pain but no frank radicular symptoms.  Some right hand pain but nothing like the left.  Denies any diabetes or other medical issues.  She has been using gabapentin and anti-inflammatories without much relief.  Rates her pain and function as moderate at all and 4 out of 10.   I spent more than 30 minutes speaking face-to-face with the patient with 50% of the time in counseling and discussing coordination of care.   Review of Systems  Musculoskeletal:  Positive for joint pain and neck pain.  Neurological:  Positive for tingling.  All other systems reviewed and are negative.  Otherwise per HPI.  Assessment & Plan: Visit Diagnoses:    ICD-10-CM   1. Paresthesia of skin  R20.2 NCV with EMG (electromyography)    2. Pain in left hand  M79.642     3. Cervicalgia  M54.2       Plan: Impression: Differential diagnosis includes osteoarthritis of the hand and thumb along with carpal tunnel syndrome and possible C6 radiculopathy.  Electrodiagnostic study performed.  The  above electrodiagnostic study is ABNORMAL and reveals evidence of a mild left median nerve entrapment at the wrist (carpal tunnel syndrome) affecting sensory components.   There is no significant electrodiagnostic evidence of any other focal nerve entrapment, brachial plexopathy or cervical radiculopathy.  As you know, this particular electrodiagnostic study cannot rule out chemical radiculitis or sensory only radiculopathy.  Recommendations: 1.  Follow-up with referring physician. 2.  Continue current management of symptoms.  Consider diagnostic injection. 3.  Continue use of resting splint at night-time and as needed during the day.  Meds & Orders: No orders of the defined types were placed in this encounter.   Orders Placed This Encounter  Procedures   NCV with EMG (electromyography)    Follow-up: Return for G. Dorene Grebe, MD.   Procedures: No procedures performed  EMG & NCV Findings: Evaluation of the left median (across palm) sensory nerve showed no response (Palm) and prolonged distal peak latency (4.2 ms).  All remaining nerves (as indicated in the following tables) were within normal limits.    All examined muscles (as indicated in the following table) showed no evidence of electrical instability.    Impression: The above electrodiagnostic study is ABNORMAL and reveals evidence of a mild left median nerve entrapment at the wrist (carpal tunnel syndrome) affecting sensory components.   There is no significant electrodiagnostic evidence of any other focal nerve entrapment, brachial plexopathy or cervical radiculopathy.  As you  know, this particular electrodiagnostic study cannot rule out chemical radiculitis or sensory only radiculopathy.  Recommendations: 1.  Follow-up with referring physician. 2.  Continue current management of symptoms.  Consider diagnostic injection. 3.  Continue use of resting splint at night-time and as needed during the  day.  ___________________________ Elease Hashimoto Board Certified, American Board of Physical Medicine and Rehabilitation    Nerve Conduction Studies Anti Sensory Summary Table   Stim Site NR Peak (ms) Norm Peak (ms) P-T Amp (V) Norm P-T Amp Site1 Site2 Delta-P (ms) Dist (cm) Vel (m/s) Norm Vel (m/s)  Left Median Acr Palm Anti Sensory (2nd Digit)  32C  Wrist    *4.2 <3.6 24.0 >10 Wrist Palm  0.0    Palm *NR  <2.0          Left Radial Anti Sensory (Base 1st Digit)  32.1C  Wrist    2.1 <3.1 32.7  Wrist Base 1st Digit 2.1 0.0    Left Ulnar Anti Sensory (5th Digit)  32.3C  Wrist    3.2 <3.7 19.1 >15.0 Wrist 5th Digit 3.2 14.0 44 >38   Motor Summary Table   Stim Site NR Onset (ms) Norm Onset (ms) O-P Amp (mV) Norm O-P Amp Site1 Site2 Delta-0 (ms) Dist (cm) Vel (m/s) Norm Vel (m/s)  Left Median Motor (Abd Poll Brev)  32.3C  Wrist    4.0 <4.2 5.5 >5 Elbow Wrist 4.2 21.0 50 >50  Elbow    8.2  4.9         Left Ulnar Motor (Abd Dig Min)  32.4C  Wrist    2.7 <4.2 6.0 >3 B Elbow Wrist 3.6 21.0 58 >53  B Elbow    6.3  5.3  A Elbow B Elbow 1.4 10.0 71 >53  A Elbow    7.7  5.1          EMG   Side Muscle Nerve Root Ins Act Fibs Psw Amp Dur Poly Recrt Int Dennie Bible Comment  Left Abd Poll Brev Median C8-T1 Nml Nml Nml Nml Nml 0 Nml Nml   Left 1stDorInt Ulnar C8-T1 Nml Nml Nml Nml Nml 0 Nml Nml   Left PronatorTeres Median C6-7 Nml Nml Nml Nml Nml 0 Nml Nml   Left Biceps Musculocut C5-6 Nml Nml Nml Nml Nml 0 Nml Nml   Left Deltoid Axillary C5-6 Nml Nml Nml Nml Nml 0 Nml Nml     Nerve Conduction Studies Anti Sensory Left/Right Comparison   Stim Site L Lat (ms) R Lat (ms) L-R Lat (ms) L Amp (V) R Amp (V) L-R Amp (%) Site1 Site2 L Vel (m/s) R Vel (m/s) L-R Vel (m/s)  Median Acr Palm Anti Sensory (2nd Digit)  32C  Wrist *4.2   24.0   Wrist Palm     Palm             Radial Anti Sensory (Base 1st Digit)  32.1C  Wrist 2.1   32.7   Wrist Base 1st Digit     Ulnar Anti Sensory (5th Digit)   32.3C  Wrist 3.2   19.1   Wrist 5th Digit 44     Motor Left/Right Comparison   Stim Site L Lat (ms) R Lat (ms) L-R Lat (ms) L Amp (mV) R Amp (mV) L-R Amp (%) Site1 Site2 L Vel (m/s) R Vel (m/s) L-R Vel (m/s)  Median Motor (Abd Poll Brev)  32.3C  Wrist 4.0   5.5   Elbow Wrist 50    Elbow  8.2   4.9         Ulnar Motor (Abd Dig Min)  32.4C  Wrist 2.7   6.0   B Elbow Wrist 58    B Elbow 6.3   5.3   A Elbow B Elbow 71    A Elbow 7.7   5.1            Waveforms:             Clinical History: No specialty comments available.     Objective:  VS:  HT:    WT:   BMI:     BP:   HR: bpm  TEMP: ( )  RESP:  Physical Exam Vitals and nursing note reviewed.  Constitutional:      General: She is not in acute distress.    Appearance: Normal appearance. She is well-developed. She is not ill-appearing.  HENT:     Head: Normocephalic and atraumatic.  Eyes:     Conjunctiva/sclera: Conjunctivae normal.     Pupils: Pupils are equal, round, and reactive to light.  Cardiovascular:     Rate and Rhythm: Normal rate.     Pulses: Normal pulses.  Pulmonary:     Effort: Pulmonary effort is normal.  Musculoskeletal:        General: No swelling, tenderness or deformity.     Right lower leg: No edema.     Left lower leg: No edema.     Comments: Inspection reveals no atrophy of the bilateral APB or FDI or hand intrinsics. There is no swelling, color changes, allodynia or dystrophic changes. There is 5 out of 5 strength in the bilateral wrist extension, finger abduction and long finger flexion. There is intact sensation to light touch in all dermatomal and peripheral nerve distributions.  There is a negative Tinel's test at the bilateral wrist and elbow. There is a equivocally positive Phalen's test bilaterally. There is a negative Hoffmann's test bilaterally.  Skin:    General: Skin is warm and dry.     Findings: No erythema or rash.  Neurological:     General: No focal deficit present.      Mental Status: She is alert and oriented to person, place, and time.     Sensory: No sensory deficit.     Motor: No weakness or abnormal muscle tone.     Coordination: Coordination normal.     Gait: Gait normal.  Psychiatric:        Mood and Affect: Mood normal.        Behavior: Behavior normal.      Imaging: No results found.

## 2023-02-05 NOTE — Progress Notes (Signed)
Functional Pain Scale - descriptive words and definitions  Moderate (4)   Constantly aware of pain, can complete ADLs with modification/sleep marginally affected at times/passive distraction is of no use, but active distraction gives some relief. Moderate range order  Average Pain 5-7  Right handed. Numbness in left hand, mainly in index, middle, and ring finger. Has pain and tingling in left wrist and forearm

## 2023-02-15 NOTE — Procedures (Signed)
EMG & NCV Findings: Evaluation of the left median (across palm) sensory nerve showed no response (Palm) and prolonged distal peak latency (4.2 ms).  All remaining nerves (as indicated in the following tables) were within normal limits.    All examined muscles (as indicated in the following table) showed no evidence of electrical instability.    Impression: The above electrodiagnostic study is ABNORMAL and reveals evidence of a mild left median nerve entrapment at the wrist (carpal tunnel syndrome) affecting sensory components.   There is no significant electrodiagnostic evidence of any other focal nerve entrapment, brachial plexopathy or cervical radiculopathy.  As you know, this particular electrodiagnostic study cannot rule out chemical radiculitis or sensory only radiculopathy.  Recommendations: 1.  Follow-up with referring physician. 2.  Continue current management of symptoms.  Consider diagnostic injection. 3.  Continue use of resting splint at night-time and as needed during the day.  ___________________________ [name] Board Certified, American Board of Physical Medicine and Rehabilitation    Nerve Conduction Studies Anti Sensory Summary Table   Stim Site NR Peak (ms) Norm Peak (ms) P-T Amp (V) Norm P-T Amp Site1 Site2 Delta-P (ms) Dist (cm) Vel (m/s) Norm Vel (m/s)  Left Median Acr Palm Anti Sensory (2nd Digit)  32C  Wrist    *4.2 <3.6 24.0 >10 Wrist Palm  0.0    Palm *NR  <2.0          Left Radial Anti Sensory (Base 1st Digit)  32.1C  Wrist    2.1 <3.1 32.7  Wrist Base 1st Digit 2.1 0.0    Left Ulnar Anti Sensory (5th Digit)  32.3C  Wrist    3.2 <3.7 19.1 >15.0 Wrist 5th Digit 3.2 14.0 44 >38   Motor Summary Table   Stim Site NR Onset (ms) Norm Onset (ms) O-P Amp (mV) Norm O-P Amp Site1 Site2 Delta-0 (ms) Dist (cm) Vel (m/s) Norm Vel (m/s)  Left Median Motor (Abd Poll Brev)  32.3C  Wrist    4.0 <4.2 5.5 >5 Elbow Wrist 4.2 21.0 50 >50  Elbow    8.2  4.9          Left Ulnar Motor (Abd Dig Min)  32.4C  Wrist    2.7 <4.2 6.0 >3 B Elbow Wrist 3.6 21.0 58 >53  B Elbow    6.3  5.3  A Elbow B Elbow 1.4 10.0 71 >53  A Elbow    7.7  5.1          EMG   Side Muscle Nerve Root Ins Act Fibs Psw Amp Dur Poly Recrt Int Dennie Bible Comment  Left Abd Poll Brev Median C8-T1 Nml Nml Nml Nml Nml 0 Nml Nml   Left 1stDorInt Ulnar C8-T1 Nml Nml Nml Nml Nml 0 Nml Nml   Left PronatorTeres Median C6-7 Nml Nml Nml Nml Nml 0 Nml Nml   Left Biceps Musculocut C5-6 Nml Nml Nml Nml Nml 0 Nml Nml   Left Deltoid Axillary C5-6 Nml Nml Nml Nml Nml 0 Nml Nml     Nerve Conduction Studies Anti Sensory Left/Right Comparison   Stim Site L Lat (ms) R Lat (ms) L-R Lat (ms) L Amp (V) R Amp (V) L-R Amp (%) Site1 Site2 L Vel (m/s) R Vel (m/s) L-R Vel (m/s)  Median Acr Palm Anti Sensory (2nd Digit)  32C  Wrist *4.2   24.0   Wrist [employer]  Radial Anti Sensory (Base 1st Digit)  32.1C  Wrist 2.1   32.7   Wrist Base 1st Digit     Ulnar Anti Sensory (5th Digit)  32.3C  Wrist 3.2   19.1   Wrist 5th Digit 44     Motor Left/Right Comparison   Stim Site L Lat (ms) R Lat (ms) L-R Lat (ms) L Amp (mV) R Amp (mV) L-R Amp (%) Site1 Site2 L Vel (m/s) R Vel (m/s) L-R Vel (m/s)  Median Motor (Abd Poll Brev)  32.3C  Wrist 4.0   5.5   Elbow Wrist 50    Elbow 8.2   4.9         Ulnar Motor (Abd Dig Min)  32.4C  Wrist 2.7   6.0   B Elbow Wrist 58    B Elbow 6.3   5.3   A Elbow B Elbow 71    A Elbow 7.7   5.1            Waveforms:

## 2023-02-18 ENCOUNTER — Encounter: Payer: Self-pay | Admitting: Orthopedic Surgery

## 2023-02-18 ENCOUNTER — Ambulatory Visit: Payer: 59 | Admitting: Orthopedic Surgery

## 2023-02-18 DIAGNOSIS — R202 Paresthesia of skin: Secondary | ICD-10-CM | POA: Diagnosis not present

## 2023-02-18 NOTE — Progress Notes (Signed)
   Office Visit Note   Patient: [name]           Date of Birth: 06/04/1966           MRN: [id number] Visit Date: 02/18/2023 Requested by: No referring provider defined for this encounter. PCP: Pcp, No  Subjective: Chief Complaint  Patient presents with   Other     Review EMG/NCV    HPI: [name] is a 57 y.o. female who presents to the office reporting left-sided numbness and tingling in her hand.  Since she was last seen she had an EMG nerve study which shows mild carpal tunnel syndrome.  She has a splint.  Some nights her hand bothers her with digits 2 and 3 going numb.  Her ulnar-sided pain which was sharp and shooting has since resolved..                ROS: All systems reviewed are negative as they relate to the chief complaint within the history of present illness.  Patient denies fevers or chills.  Assessment & Plan: Visit Diagnoses:  1. Paresthesia of skin     Plan: Impression is mild carpal tunnel syndrome.  She has done well with her right carpal tunnel release done over 10 years ago.  Left one is not quite very yet in terms of requiring intervention.  Continue use of night splint.  She will follow-up if she wants to proceed with further intervention after becoming clinically more symptomatic.  Follow-Up Instructions: No follow-ups on file.   Orders:  No orders of the defined types were placed in this encounter.  No orders of the defined types were placed in this encounter.     Procedures: No procedures performed   Clinical Data: No additional findings.  Objective: Vital Signs: There were no vitals taken for this visit.  Physical Exam:  Constitutional: Patient appears well-developed HEENT:  Head: Normocephalic Eyes:EOM are normal Neck: Normal range of motion Cardiovascular: Normal rate Pulmonary/chest: Effort normal Neurologic: Patient is alert Skin: Skin is warm Psychiatric: Patient has normal mood and affect  Ortho Exam:  Ortho exam demonstrates 5 out of 5 grip EPL FPL interosseous wrist flexion wrist extension bicep triceps and deltoid strength.  Negative Tinel's cubital tunnel.  No abductor pollicis brevis wasting.  Radial pulse intact.  Negative Tinel's cubital tunnel in the elbow  Specialty Comments:  No specialty comments available.  Imaging: No results found.   PMFS History: Patient Active Problem List   Diagnosis Date Noted   Hammertoe of left foot 05/27/2017   Spasm of muscle 05/27/2017   Herpes simplex 10/02/2016   History reviewed. No pertinent past medical history.  History reviewed. No pertinent family history.  History reviewed. No pertinent surgical history. Social History   Occupational History   Not on file  Tobacco Use   Smoking status: Former   Smokeless tobacco: Never  Substance and Sexual Activity   Alcohol use: Not on file   Drug use: Not on file   Sexual activity: Not on file

## 2023-05-11 ENCOUNTER — Ambulatory Visit (HOSPITAL_BASED_OUTPATIENT_CLINIC_OR_DEPARTMENT_OTHER): Payer: 59 | Admitting: Family Medicine
